# Patient Record
Sex: Female | Born: 2000 | Race: White | Hispanic: No | Marital: Single | Smoking: Former smoker
Health system: Southern US, Community
[De-identification: ages and names within clinical notes are randomized; demographics above are authoritative.]

## PROBLEM LIST (undated history)

## (undated) ENCOUNTER — Inpatient Hospital Stay (HOSPITAL_COMMUNITY): Payer: Self-pay

## (undated) DIAGNOSIS — J45909 Unspecified asthma, uncomplicated: Secondary | ICD-10-CM

## (undated) HISTORY — PX: OTHER SURGICAL HISTORY: SHX169

## (undated) HISTORY — PX: WISDOM TOOTH EXTRACTION: SHX21

## (undated) HISTORY — PX: TONSILLECTOMY: SUR1361

---

## 2018-09-20 ENCOUNTER — Inpatient Hospital Stay (HOSPITAL_COMMUNITY)
Admission: AD | Admit: 2018-09-20 | Discharge: 2018-09-20 | Disposition: A | Discharge status: Home / Self Care | Payer: Medicaid - Out of State | Attending: Obstetrics & Gynecology | Admitting: Obstetrics & Gynecology

## 2018-09-20 ENCOUNTER — Inpatient Hospital Stay (HOSPITAL_COMMUNITY): Payer: Medicaid - Out of State

## 2018-09-20 ENCOUNTER — Encounter (HOSPITAL_COMMUNITY): Payer: Self-pay | Admitting: *Deleted

## 2018-09-20 ENCOUNTER — Other Ambulatory Visit: Payer: Self-pay

## 2018-09-20 DIAGNOSIS — O219 Vomiting of pregnancy, unspecified: Secondary | ICD-10-CM

## 2018-09-20 DIAGNOSIS — Z880 Allergy status to penicillin: Secondary | ICD-10-CM | POA: Diagnosis not present

## 2018-09-20 DIAGNOSIS — Z3A01 Less than 8 weeks gestation of pregnancy: Secondary | ICD-10-CM | POA: Insufficient documentation

## 2018-09-20 DIAGNOSIS — O26891 Other specified pregnancy related conditions, first trimester: Secondary | ICD-10-CM

## 2018-09-20 DIAGNOSIS — R1030 Lower abdominal pain, unspecified: Secondary | ICD-10-CM | POA: Insufficient documentation

## 2018-09-20 DIAGNOSIS — O210 Mild hyperemesis gravidarum: Secondary | ICD-10-CM | POA: Insufficient documentation

## 2018-09-20 DIAGNOSIS — Z87891 Personal history of nicotine dependence: Secondary | ICD-10-CM | POA: Diagnosis not present

## 2018-09-20 DIAGNOSIS — R102 Pelvic and perineal pain: Secondary | ICD-10-CM

## 2018-09-20 HISTORY — DX: Unspecified asthma, uncomplicated: J45.909

## 2018-09-20 LAB — WET PREP, GENITAL
Clue Cells Wet Prep HPF POC: NONE SEEN
Sperm: NONE SEEN
Trich, Wet Prep: NONE SEEN
Yeast Wet Prep HPF POC: NONE SEEN

## 2018-09-20 LAB — URINALYSIS, ROUTINE W REFLEX MICROSCOPIC
Bilirubin Urine: NEGATIVE
GLUCOSE, UA: NEGATIVE mg/dL
Hgb urine dipstick: NEGATIVE
Ketones, ur: 20 mg/dL — AB
NITRITE: NEGATIVE
PH: 5 (ref 5.0–8.0)
Protein, ur: 30 mg/dL — AB
Specific Gravity, Urine: 1.031 — ABNORMAL HIGH (ref 1.005–1.030)

## 2018-09-20 LAB — HCG, QUANTITATIVE, PREGNANCY: hCG, Beta Chain, Quant, S: 112544 m[IU]/mL — ABNORMAL HIGH (ref <5)

## 2018-09-20 LAB — CBC
HCT: 41.7 % (ref 36.0–46.0)
Hemoglobin: 14 g/dL (ref 12.0–15.0)
MCH: 28.3 pg (ref 26.0–34.0)
MCHC: 33.6 g/dL (ref 30.0–36.0)
MCV: 84.2 fL (ref 80.0–100.0)
Platelets: 364 10*3/uL (ref 150–400)
RBC: 4.95 MIL/uL (ref 3.87–5.11)
RDW: 12.5 % (ref 11.5–15.5)
WBC: 14.1 10*3/uL — AB (ref 4.0–10.5)
nRBC: 0 % (ref 0.0–0.2)

## 2018-09-20 LAB — ABO/RH: ABO/RH(D): O NEG

## 2018-09-20 LAB — POCT PREGNANCY, URINE: Preg Test, Ur: POSITIVE — AB

## 2018-09-20 MED ORDER — LACTATED RINGERS IV BOLUS: 1000.0000 mL | Freq: Once | INTRAVENOUS | Status: DC

## 2018-09-20 MED ORDER — SCOPOLAMINE 1 MG/3DAYS TD PT72: 1.0000 | MEDICATED_PATCH | TRANSDERMAL | 12 refills | Status: AC

## 2018-09-20 MED ORDER — PROMETHAZINE HCL 25 MG/ML IJ SOLN
12.5000 mg | Freq: Once | INTRAMUSCULAR | Status: AC
  Administered 2018-09-20: 12.5 mg via INTRAVENOUS
  Filled 2018-09-20: qty 1

## 2018-09-20 MED ORDER — SODIUM CHLORIDE 0.9 % IV SOLN
8.0000 mg | Freq: Once | INTRAVENOUS | Status: AC
  Administered 2018-09-20: 8 mg via INTRAVENOUS
  Filled 2018-09-20: qty 4

## 2018-09-20 MED ORDER — LACTATED RINGERS IV BOLUS
1000.0000 mL | Freq: Once | INTRAVENOUS | Status: AC
  Administered 2018-09-20: 1000 mL via INTRAVENOUS

## 2018-09-20 MED ORDER — PROMETHAZINE HCL 25 MG PO TABS: 12.5000 mg | ORAL_TABLET | Freq: Four times a day (QID) | ORAL | 0 refills | Status: AC | PRN

## 2018-09-20 MED ORDER — SCOPOLAMINE 1 MG/3DAYS TD PT72
1.0000 | MEDICATED_PATCH | TRANSDERMAL | Status: DC
  Administered 2018-09-20: 1.5 mg via TRANSDERMAL
  Filled 2018-09-20: qty 1

## 2018-09-20 MED ORDER — PROMETHAZINE HCL 25 MG/ML IJ SOLN
12.5000 mg | Freq: Once | INTRAMUSCULAR | Status: DC
  Administered 2018-09-20: 12.5 mg via INTRAVENOUS
  Filled 2018-09-20: qty 1

## 2018-09-20 NOTE — Clinical Social Work Note (Signed)
Subjective: Teresa Duncan is a G1P0 at [redacted]w[redacted]d who presents to the [facility] today for MAU visit.  She does not have a history of any mental health concerns. She is currently sexually active. She is currently using no method for birth control. Patient states boyfriend and boyfriend mother as her support system.   BP 129/87   Pulse 92   Temp 98.3 F (36.8 C)   Resp 18   LMP 08/06/2018 (Approximate)   Birth Control History:  Pills, Nexplanon  MDM Patient counseled on all options for birth control today including LARC. Patient desires nexplanon initiated for birth control.   Assessment:  18 y.o. female considering nexplanon for birth control  Plan: No further plan   Gwyndolyn Saxon, [place] 09/20/2018 12:33 PM

## 2018-09-20 NOTE — MAU Note (Signed)
Pt presents to MAU with complaints of nausea and vomiting with lower abdominal cramping. + pregnancy test a couple of weeks ago

## 2018-09-20 NOTE — Discharge Instructions (Signed)
[facility] Prenatal [facility] Providers °         °[facility]  ° Phone: [phone] ° °[facility]  ° Phone: [phone] ° °[facility]      ° Phone: [phone]   °         °[facility]    ° Phone: [phone] °         °[facility] [place] [facility]  ° Phone: [phone] ° °[facility] ° Phone: [phone] °    °[facility] ° Phone: [phone] °Safe Medications in Pregnancy  ° °Acne: °Benzoyl Peroxide °Salicylic Acid ° °Backache/Headache: °Tylenol: 2 regular strength every 4 hours OR °             2 Extra strength every 6 hours ° °Colds/Coughs/Allergies: °Benadryl (alcohol free) 25 mg every 6 hours as needed °Breath right strips °Claritin °Cepacol throat lozenges °Chloraseptic throat spray °Cold-Eeze- up to three times per day °Cough drops, alcohol free °Flonase (by prescription only) °Guaifenesin °Mucinex °Robitussin DM (plain only, alcohol free) °Saline nasal spray/drops °Sudafed (pseudoephedrine) & Actifed ** use only after [redacted] weeks gestation and if you do not have high blood pressure °Tylenol °Vicks Vaporub °Zinc lozenges °Zyrtec  ° °Constipation: °Colace °Ducolax suppositories °Fleet enema °Glycerin suppositories °Metamucil °Milk of magnesia °Miralax °Senokot °Smooth move tea ° °Diarrhea: °Kaopectate °Imodium A-D ° °*NO pepto Bismol ° °Hemorrhoids: °Anusol °Anusol HC °Preparation H °Tucks ° °Indigestion: °Tums °Maalox °Mylanta °Zantac  °Pepcid ° °Insomnia: °Benadryl (alcohol free) 25mg every 6 hours as needed °Tylenol PM °Unisom, no Gelcaps ° °Leg Cramps: °Tums °MagGel ° °Nausea/Vomiting:  °Bonine °Dramamine °Emetrol °Ginger extract °Sea bands °Meclizine  °Nausea medication to take during pregnancy:  °Unisom (doxylamine succinate 25 mg tablets) Take one tablet daily at bedtime. If symptoms are not adequately controlled, the dose  can be increased to a maximum recommended dose of two tablets daily (1/2 tablet in the morning, 1/2 tablet mid-afternoon and one at bedtime). °Vitamin B6 100mg tablets. Take one tablet twice a day (up to 200 mg per day). ° °Skin Rashes: °Aveeno products °Benadryl cream or 25mg every 6 hours as needed °Calamine Lotion °1% cortisone cream ° °Yeast infection: °Gyne-lotrimin 7 °Monistat 7 ° ° °**If taking multiple medications, please check labels to avoid duplicating the same active ingredients °**take medication as directed on the label °** Do not exceed 4000 mg of tylenol in 24 hours °**Do not take medications that contain aspirin or ibuprofen ° ° ° ° °

## 2018-09-20 NOTE — MAU Provider Note (Signed)
History     CSN: [id number]  Arrival date and time: 09/20/18 3474   First Provider Initiated Contact with Patient 09/20/18 0845      Chief Complaint  Patient presents with  . Pelvic Pain  . Emesis   Teresa Duncan is a 18 y.o. G1P0 at [redacted]w[redacted]d (approximate based on uncertain LMP) is here today with lower abdominal pain, nausea and vomiting.   Pelvic Pain  The patient's primary symptoms include pelvic pain. The patient's pertinent negatives include no vaginal discharge. This is a new problem. The current episode started in the past 7 days. The problem occurs intermittently. The problem has been unchanged. Pain severity now: 7/10. The problem affects both sides. She is pregnant. Associated symptoms include abdominal pain, nausea and vomiting. Pertinent negatives include no chills, diarrhea, dysuria, fever or frequency. The vaginal discharge was normal. There has been no bleeding. Nothing aggravates the symptoms. She has tried nothing for the symptoms. She is sexually active. She uses nothing for contraception. Her menstrual history has been regular (Uncertain LMP. Maybe the end of November. ).  Emesis   This is a new problem. The current episode started in the past 7 days. The problem occurs 5 to 10 times per day. The problem has been unchanged. The emesis has an appearance of stomach contents. There has been no fever. Associated symptoms include abdominal pain. Pertinent negatives include no chills, diarrhea or fever. Risk factors: pregnancy  She has tried nothing for the symptoms.    OB History    Gravida  1   Para      Term      Preterm      AB      Living        SAB      TAB      Ectopic      Multiple      Live Births              Past Medical History:  Diagnosis Date  . Asthma     Past Surgical History:  Procedure Laterality Date  . TONSILLECTOMY    . tubes in ears    . WISDOM TOOTH EXTRACTION      History reviewed. No pertinent family history.  Social  History   Tobacco Use  . Smoking status: Former Games developer  . Smokeless tobacco: Former Engineer, water Use Topics  . Alcohol use: Not Currently  . Drug use: Never    Allergies:  Allergies  Allergen Reactions  . Augmentin [Amoxicillin-Pot Clavulanate] Nausea And Vomiting    No medications prior to admission.    Review of Systems  Constitutional: Negative for chills and fever.  Gastrointestinal: Positive for abdominal pain, nausea and vomiting. Negative for diarrhea.  Genitourinary: Positive for pelvic pain. Negative for dysuria, frequency, vaginal bleeding and vaginal discharge.   Physical Exam   Blood pressure 129/87, pulse 92, temperature 98.3 F (36.8 C), resp. rate 18, last menstrual period 08/06/2018.  Physical Exam  Nursing note and vitals reviewed. Constitutional: She is oriented to person, place, and time. She appears well-developed and well-nourished. No distress.  HENT:  Head: Normocephalic.  Cardiovascular: Normal rate.  Respiratory: Effort normal.  GI: Soft. There is no abdominal tenderness. There is no rebound.  Neurological: She is alert and oriented to person, place, and time.  Skin: Skin is warm and dry.  Psychiatric: She has a normal mood and affect.   Results for orders placed or performed during the hospital encounter of  09/20/18 (from the past 24 hour(s))  Urinalysis, Routine w reflex microscopic     Status: Abnormal   Collection Time: 09/20/18  8:22 AM  Result Value Ref Range   Color, Urine YELLOW YELLOW   APPearance TURBID (A) CLEAR   Specific Gravity, Urine 1.031 (H) 1.005 - 1.030   pH 5.0 5.0 - 8.0   Glucose, UA NEGATIVE NEGATIVE mg/dL   Hgb urine dipstick NEGATIVE NEGATIVE   Bilirubin Urine NEGATIVE NEGATIVE   Ketones, ur 20 (A) NEGATIVE mg/dL   Protein, ur 30 (A) NEGATIVE mg/dL   Nitrite NEGATIVE NEGATIVE   Leukocytes, UA MODERATE (A) NEGATIVE   WBC, UA 21-50 0 - 5 WBC/hpf   Bacteria, UA FEW (A) NONE SEEN   Squamous Epithelial / LPF  21-50 0 - 5   Mucus PRESENT    Amorphous Crystal PRESENT   CBC     Status: Abnormal   Collection Time: 09/20/18  8:35 AM  Result Value Ref Range   WBC 14.1 (H) 4.0 - 10.5 K/uL   RBC 4.95 3.87 - 5.11 MIL/uL   Hemoglobin 14.0 12.0 - 15.0 g/dL   HCT 16.141.7 09.636.0 - 04.546.0 %   MCV 84.2 80.0 - 100.0 fL   MCH 28.3 26.0 - 34.0 pg   MCHC 33.6 30.0 - 36.0 g/dL   RDW 40.912.5 81.111.5 - 91.415.5 %   Platelets 364 150 - 400 K/uL   nRBC 0.0 0.0 - 0.2 %  hCG, quantitative, pregnancy     Status: Abnormal   Collection Time: 09/20/18  8:35 AM  Result Value Ref Range   hCG, Beta Chain, Quant, S 112,544 (H) <5 mIU/mL  ABO/Rh     Status: None (Preliminary result)   Collection Time: 09/20/18  8:35 AM  Result Value Ref Range   ABO/RH(D)      O NEG Performed at [facility], [address] [place], [place] [id number]   Pregnancy, urine POC     Status: Abnormal   Collection Time: 09/20/18  8:38 AM  Result Value Ref Range   Preg Test, Ur POSITIVE (A) NEGATIVE  Wet prep, genital     Status: Abnormal   Collection Time: 09/20/18  8:48 AM  Result Value Ref Range   Yeast Wet Prep HPF POC NONE SEEN NONE SEEN   Trich, Wet Prep NONE SEEN NONE SEEN   Clue Cells Wet Prep HPF POC NONE SEEN NONE SEEN   WBC, Wet Prep HPF POC MANY (A) NONE SEEN   Sperm NONE SEEN    [place] Ob Less Than 14 Weeks With Ob Transvaginal  Result Date: 09/20/2018 CLINICAL DATA:  Patient with lower abdominal cramping. Nausea and vomiting. EXAM: OBSTETRIC <14 WK US AND TRANSVAGINAL OB US TECHNIQUE: Both transabdominal and transvaginal ultrasound examinations were performed for complete evaluation of the gestation as well as the maternal uterus, adnexal regions, and pelvic cul-de-sac. Transvaginal technique was performed to assess early pregnancy. COMPARISON:  None. FINDINGS: Intrauterine gestational sac: Single Yolk sac:  Visualized. Embryo:  Visualized. Cardiac Activity: Visualized. Heart Rate: 135 bpm CRL:  7.61 mm   6 w   4 d                  US EDC:  05/12/2019 Subchorionic hemorrhage:  None visualized. Maternal uterus/adnexae: Normal right and left ovaries. Right ovarian corpus luteum. Trace free fluid in the pelvis. IMPRESSION: Single live intrauterine gestation.  No subchorionic hemorrhage. Electronically Signed   By: Annia Beltrew  Davis M.D.   On: 09/20/2018 10:01  MAU Course  Procedures  MDM 10:18 AM patient given PO challenge with ginger ale and immediately vomited. Will given another 12.5 mg phenergan.   Will base EDC on today's [place] since the LMP documented was a "guess".   10:58 AM patient still vomiting after a total of 25mg  phenergan. Will try Zofran.   11:25 AM patient continues to have vomiting. Will apply scopolamine patch.   1:17 PM Scop patch applied by RN. Patient is now resting comfortably and has sips of ginger ale without further emesis. Will DC home and FU in the clinic.   Assessment and Plan   1. Nausea/vomiting in pregnancy   2. Pelvic pain in pregnancy, antepartum, first trimester   3. [redacted] weeks gestation of pregnancy    DC home Comfort measures reviewed  1st Trimester precautions  Bleeding precautions RX: phenergan PRN #30, scopolamine patch q 72 hours Return to MAU as needed FU with OB as planned  Follow-up Information    [facility] Follow up.   Specialty:  Obstetrics and Gynecology Contact information: [address] [place] [address] [phone]           Thressa Sheller DNP, CNM  09/20/18  10:18 AM

## 2018-09-21 LAB — GC/CHLAMYDIA PROBE AMP (~~LOC~~) NOT AT ARMC
Chlamydia: POSITIVE — AB
Neisseria Gonorrhea: NEGATIVE

## 2018-09-23 ENCOUNTER — Encounter: Payer: Self-pay | Admitting: Student

## 2018-09-23 DIAGNOSIS — O98811 Other maternal infectious and parasitic diseases complicating pregnancy, first trimester: Secondary | ICD-10-CM

## 2018-09-23 DIAGNOSIS — A749 Chlamydial infection, unspecified: Secondary | ICD-10-CM | POA: Insufficient documentation

## 2018-10-18 ENCOUNTER — Telehealth: Payer: Self-pay | Admitting: Student

## 2018-10-18 NOTE — Telephone Encounter (Signed)
Opened in error

## 2018-10-31 ENCOUNTER — Other Ambulatory Visit: Payer: Self-pay

## 2018-10-31 ENCOUNTER — Inpatient Hospital Stay (HOSPITAL_COMMUNITY)
Admission: AD | Admit: 2018-10-31 | Discharge: 2018-10-31 | Disposition: A | Discharge status: Home / Self Care | Payer: Medicaid - Out of State | Attending: Obstetrics & Gynecology | Admitting: Obstetrics & Gynecology

## 2018-10-31 DIAGNOSIS — Z87891 Personal history of nicotine dependence: Secondary | ICD-10-CM | POA: Insufficient documentation

## 2018-10-31 DIAGNOSIS — O26891 Other specified pregnancy related conditions, first trimester: Secondary | ICD-10-CM | POA: Insufficient documentation

## 2018-10-31 DIAGNOSIS — Z3491 Encounter for supervision of normal pregnancy, unspecified, first trimester: Secondary | ICD-10-CM

## 2018-10-31 DIAGNOSIS — Z3A12 12 weeks gestation of pregnancy: Secondary | ICD-10-CM | POA: Insufficient documentation

## 2018-10-31 DIAGNOSIS — N898 Other specified noninflammatory disorders of vagina: Secondary | ICD-10-CM | POA: Diagnosis not present

## 2018-10-31 DIAGNOSIS — Z880 Allergy status to penicillin: Secondary | ICD-10-CM | POA: Insufficient documentation

## 2018-10-31 LAB — URINALYSIS, ROUTINE W REFLEX MICROSCOPIC
Bilirubin Urine: NEGATIVE
Glucose, UA: NEGATIVE mg/dL
Hgb urine dipstick: NEGATIVE
Ketones, ur: 20 mg/dL — AB
Nitrite: NEGATIVE
Protein, ur: NEGATIVE mg/dL
SPECIFIC GRAVITY, URINE: 1.026 (ref 1.005–1.030)
pH: 5 (ref 5.0–8.0)

## 2018-10-31 LAB — WET PREP, GENITAL
Clue Cells Wet Prep HPF POC: NONE SEEN
Sperm: NONE SEEN
Trich, Wet Prep: NONE SEEN
Yeast Wet Prep HPF POC: NONE SEEN

## 2018-10-31 MED ORDER — PREPLUS 27-1 MG PO TABS: 1.0000 | ORAL_TABLET | Freq: Every day | ORAL | 13 refills | Status: AC

## 2018-10-31 NOTE — Discharge Instructions (Signed)

## 2018-10-31 NOTE — MAU Note (Signed)
Hasn't really been feeling well. This is her first, wanted to make sure everything is ok with her baby.  Has been throwing up a lot.  Has been having some back pains, none now.

## 2018-10-31 NOTE — MAU Provider Note (Signed)
History     CSN: [id number]  Arrival date and time: 10/31/18 1400   First Provider Initiated Contact with Patient 10/31/18 1821      No chief complaint on file.  HPI Teresa Duncan is a 18 y.o. G1P0 at [redacted]w[redacted]d who presents to MAU with chief complaint of nausea and "just wanting to check on baby". Patient is accompanied by her baby's paternal grandmother, Edyth Gunnels. They verbalized that they believed they had signed in to [facility] and did not realize they were presenting to MAU. Ms. Leister denies vaginal bleeding, fever, falls, or recent illness.  She complains of intermittent heavy vaginal discharge but denies other complaints.  Patient has not initiated prenatal care. She has been unable to obtain her identification from her mother and does not know her social security number.  OB History    Gravida  1   Para      Term      Preterm      AB      Living        SAB      TAB      Ectopic      Multiple      Live Births              Past Medical History:  Diagnosis Date  . Asthma     Past Surgical History:  Procedure Laterality Date  . TONSILLECTOMY    . tubes in ears    . WISDOM TOOTH EXTRACTION      No family history on file.  Social History   Tobacco Use  . Smoking status: Former Games developer  . Smokeless tobacco: Former Engineer, water Use Topics  . Alcohol use: Not Currently  . Drug use: Never    Allergies:  Allergies  Allergen Reactions  . Augmentin [Amoxicillin-Pot Clavulanate] Nausea And Vomiting    Medications Prior to Admission  Medication Sig Dispense Refill Last Dose  . promethazine (PHENERGAN) 25 MG tablet Take 0.5-1 tablets (12.5-25 mg total) by mouth every 6 (six) hours as needed. 30 tablet 0   . scopolamine (TRANSDERM-SCOP, 1.5 MG,) 1 MG/3DAYS Place 1 patch (1.5 mg total) onto the skin every 3 (three) days. 10 patch 12     Review of Systems  Constitutional: Negative for chills, fatigue and fever.  Respiratory:  Negative for shortness of breath.   Gastrointestinal: Positive for nausea. Negative for abdominal pain, diarrhea and vomiting.  Genitourinary: Negative for difficulty urinating, dysuria, flank pain, vaginal bleeding, vaginal discharge and vaginal pain.  Musculoskeletal: Negative for back pain.  All other systems reviewed and are negative.  Physical Exam   Blood pressure 122/76, pulse 88, temperature 98.5 F (36.9 C), temperature source Oral, resp. rate 16, weight 50.1 kg, SpO2 100 %.  Physical Exam  Nursing note and vitals reviewed. Constitutional: She is oriented to person, place, and time. She appears well-developed and well-nourished.  Cardiovascular: Normal rate.  Respiratory: Effort normal and breath sounds normal.  GI: Soft. She exhibits no distension. There is no abdominal tenderness. There is no rebound, no guarding and no CVA tenderness.  Musculoskeletal: Normal range of motion.  Neurological: She is alert and oriented to person, place, and time.  Skin: Skin is warm and dry.  Psychiatric: She has a normal mood and affect. Her behavior is normal. Judgment and thought content normal.    MAU Course/MDM  Procedures  --[facility] ultrasound 09/20/2018 --Pertinent negatives: dysuria, hx UTIs or kidney stones  Patient Vitals  for the past 24 hrs:  BP Temp Temp src Pulse Resp SpO2 Weight  10/31/18 1438 122/76 98.5 F (36.9 C) Oral 88 16 100 % 50.1 kg    Orders Placed This Encounter  Procedures  . Wet prep, genital  . Culture, OB Urine  . Urinalysis, Routine w reflex microscopic  . Discharge patient    Results for orders placed or performed during the hospital encounter of 10/31/18 (from the past 24 hour(s))  Urinalysis, Routine w reflex microscopic     Status: Abnormal   Collection Time: 10/31/18  3:00 PM  Result Value Ref Range   Color, Urine AMBER (A) YELLOW   APPearance CLOUDY (A) CLEAR   Specific Gravity, Urine 1.026 1.005 - 1.030   pH 5.0 5.0 - 8.0   Glucose, UA  NEGATIVE NEGATIVE mg/dL   Hgb urine dipstick NEGATIVE NEGATIVE   Bilirubin Urine NEGATIVE NEGATIVE   Ketones, ur 20 (A) NEGATIVE mg/dL   Protein, ur NEGATIVE NEGATIVE mg/dL   Nitrite NEGATIVE NEGATIVE   Leukocytes,Ua MODERATE (A) NEGATIVE   RBC / HPF 0-5 0 - 5 RBC/hpf   WBC, UA 11-20 0 - 5 WBC/hpf   Bacteria, UA RARE (A) NONE SEEN   Squamous Epithelial / LPF 11-20 0 - 5   Mucus PRESENT   Wet prep, genital     Status: Abnormal   Collection Time: 10/31/18  6:50 PM  Result Value Ref Range   Yeast Wet Prep HPF POC NONE SEEN NONE SEEN   Trich, Wet Prep NONE SEEN NONE SEEN   Clue Cells Wet Prep HPF POC NONE SEEN NONE SEEN   WBC, Wet Prep HPF POC MANY (A) NONE SEEN   Sperm NONE SEEN     Assessment and Plan  --18 y.o. G1P0 at [redacted]w[redacted]d  --FHT 174 by Doppler --Given follow up information including OB offices in [place], [facility] walk-in clinic hours, safe meds --Discharge home in stable condition  F/U: Will message Gwyndolyn Saxon, LCSW to discuss Pregnancy Medicaid, contraception, follow-up care   Calvert Cantor, CNM 10/31/2018, 7:26 PM

## 2018-11-02 LAB — CULTURE, OB URINE: Culture: >=100000 — AB

## 2018-11-04 LAB — GC/CHLAMYDIA PROBE AMP (~~LOC~~) NOT AT ARMC
Chlamydia: POSITIVE — AB
Neisseria Gonorrhea: NEGATIVE

## 2018-11-06 ENCOUNTER — Telehealth: Payer: Self-pay | Admitting: Advanced Practice Midwife

## 2018-11-06 NOTE — Telephone Encounter (Signed)
2nd attempt to reach patient to discuss swab results. Spoke with FOB's mom "Andrey Campanile" who owns the phone listed in patient chart. Asked her to notify patient I called, did not state reason or disclose any aspect of care. Andrey Campanile stated patient was with "Reita Cliche", Sandy's son but she does not know his number. She will "try to" locate patient and ask her to return my call.  Clayton Bibles, CNM 11/06/18  3:56 PM

## 2018-11-06 NOTE — Telephone Encounter (Signed)
Called to notify of POSITIVE Chlamydia. Phone number is for FOB's mother Andrey Campanile who patient would like to use as primary POC. VLTCB  Clayton Bibles, CNM 11/06/18  10:20 AM

## 2019-08-04 ENCOUNTER — Encounter (HOSPITAL_COMMUNITY): Payer: Self-pay

## 2020-01-20 IMAGING — US US OB < 14 WEEKS - US OB TV
1 series · 15 of 28 positions shown · non-contrast
Comparison: None.

CLINICAL DATA: Patient with lower abdominal cramping. Nausea and
vomiting.

EXAM:
OBSTETRIC <14 WK US AND TRANSVAGINAL OB US
TECHNIQUE: Both transabdominal and transvaginal ultrasound examinations were
performed for complete evaluation of the gestation as well as the
maternal uterus, adnexal regions, and pelvic cul-de-sac.
Transvaginal technique was performed to assess early pregnancy.

[Series 1: us ob < 14 weeks - us ob tv · 15 of 91 slices shown]
[im 1/91]
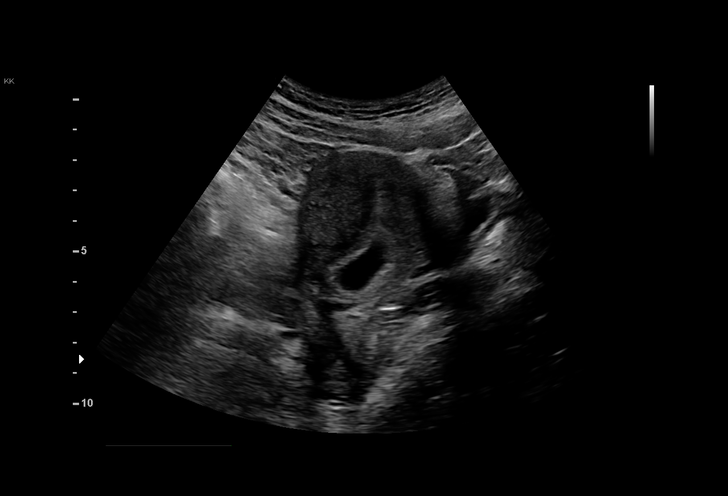
[im 7/91]
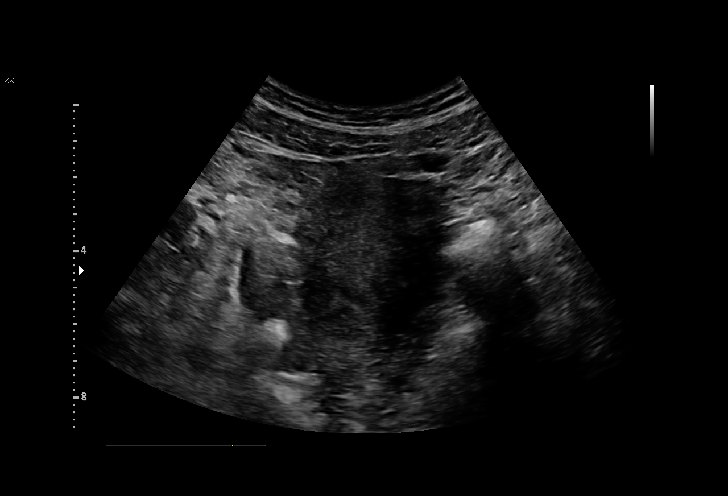
[im 14/91]
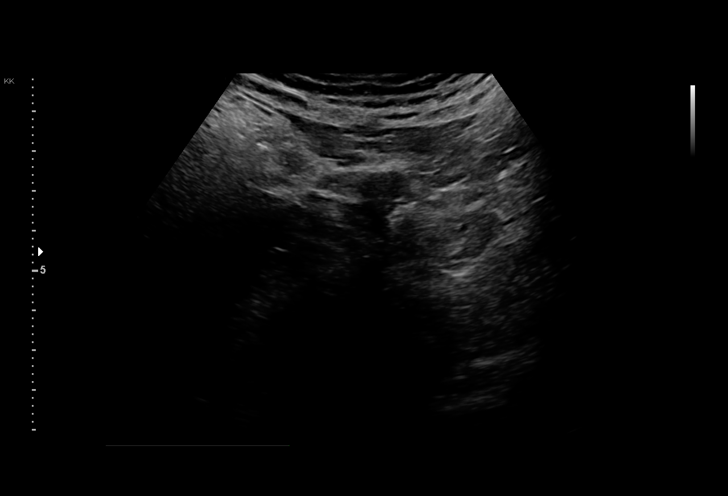
[im 21/91]
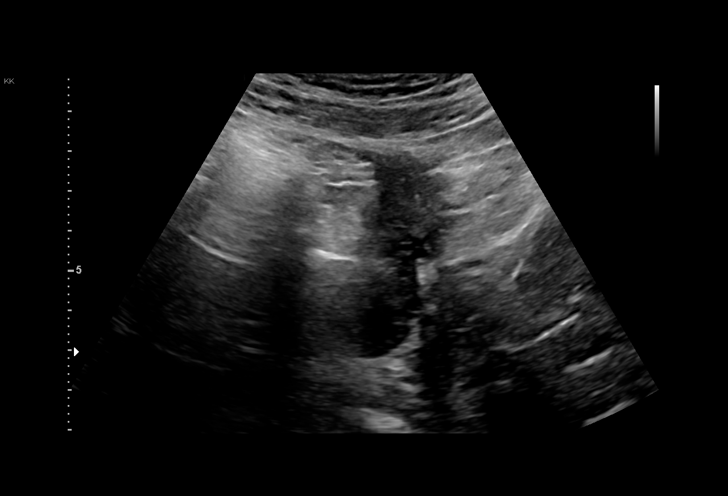
[im 27/91]
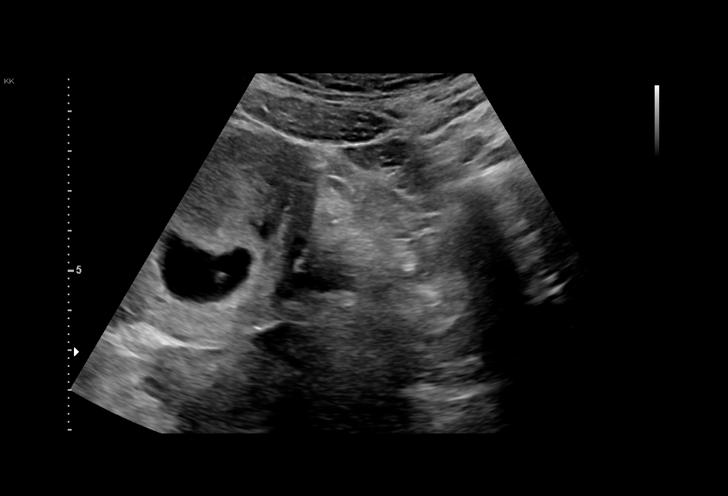
[im 34/91]
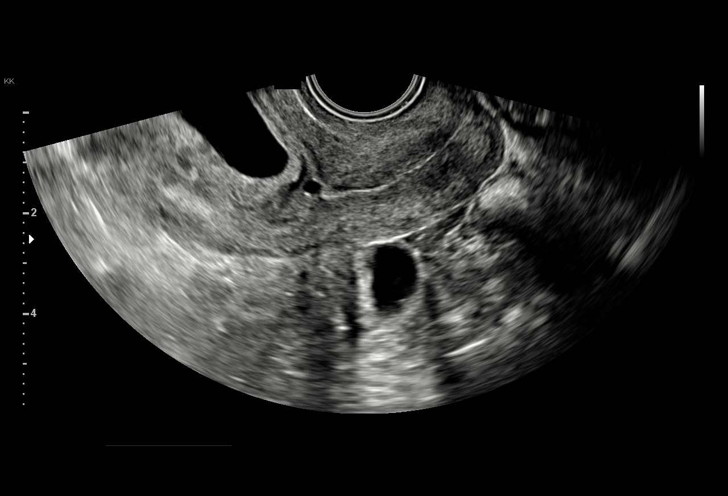
[im 41/91]
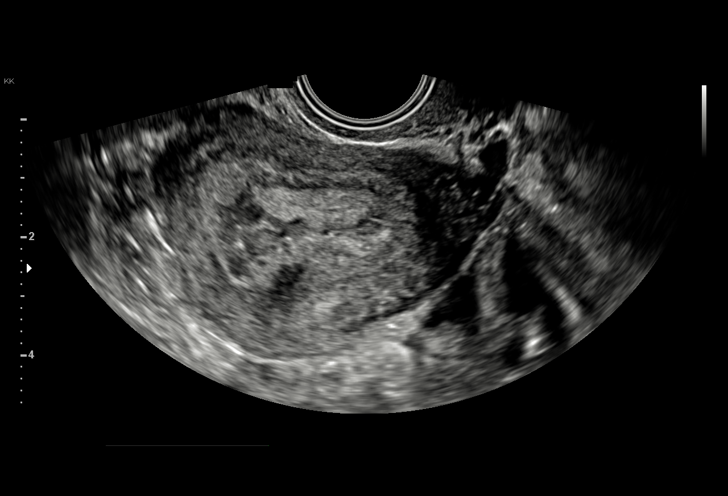
[im 47/91]
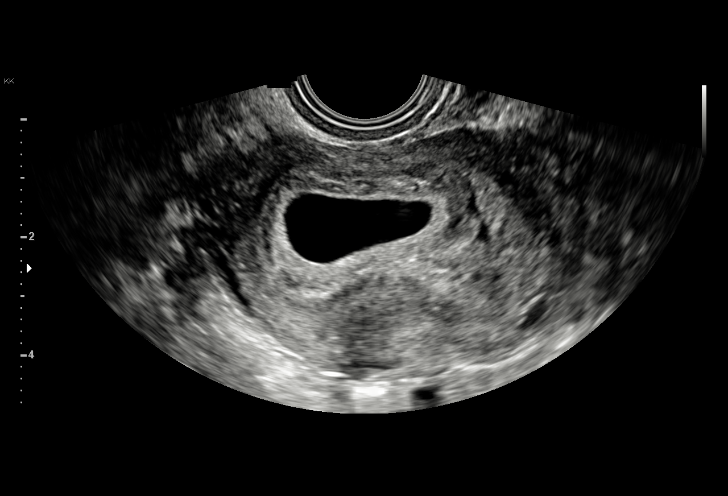
[im 51/91]
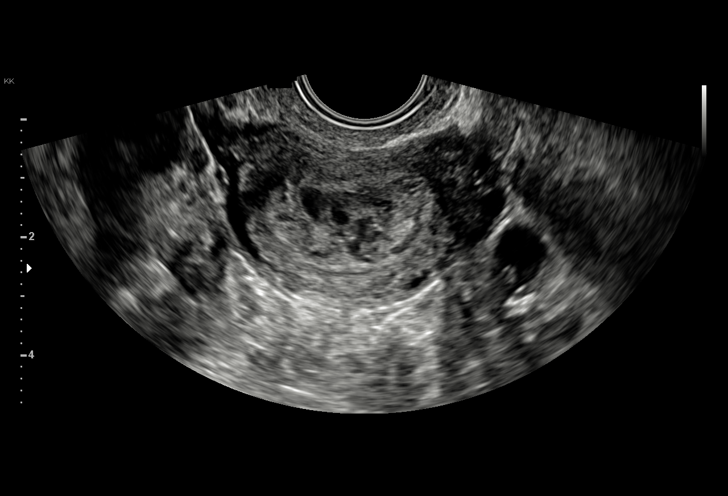
[im 57/91]
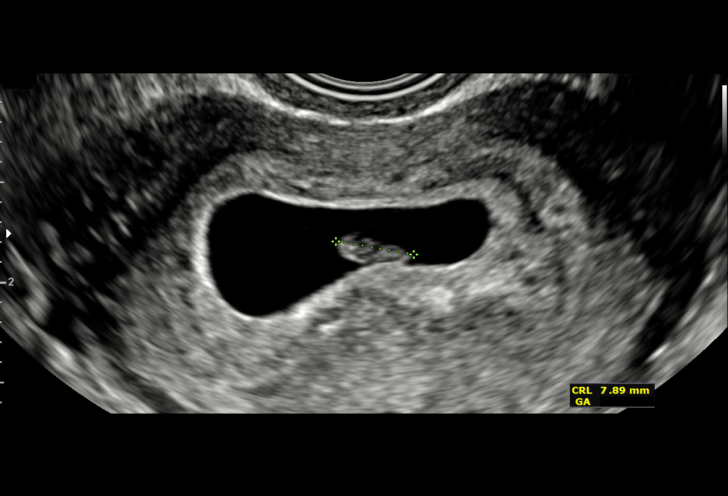
[im 64/91]
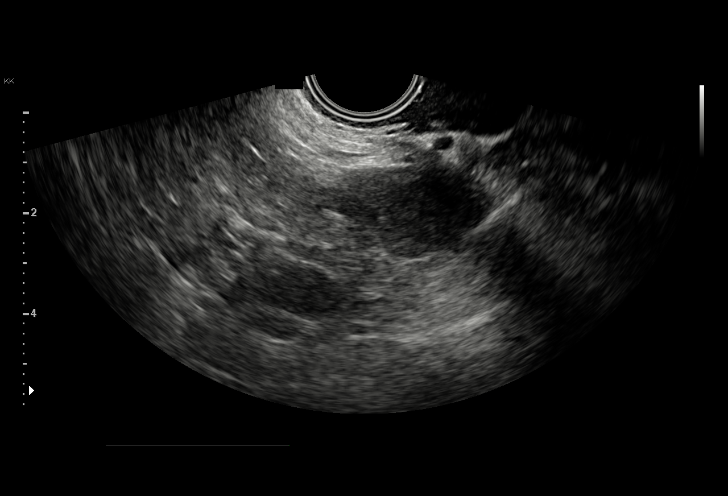
[im 71/91]
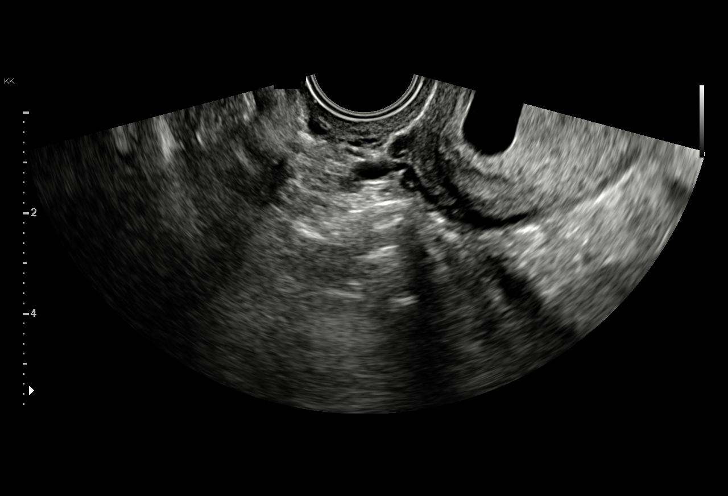
[im 77/91]
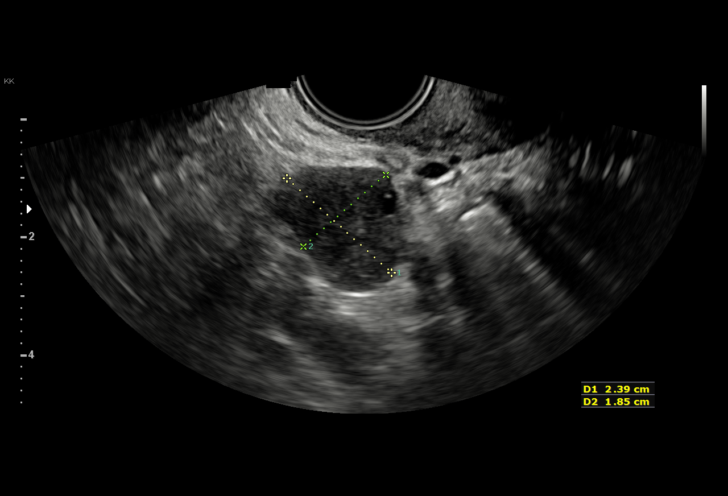
[im 84/91]
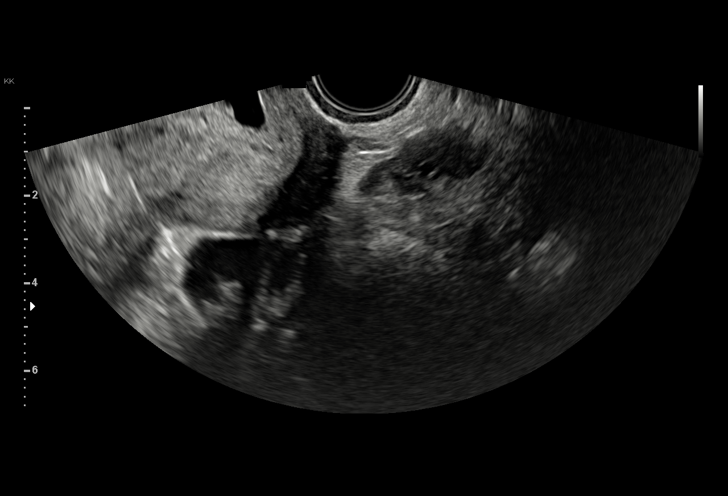
[im 91/91]
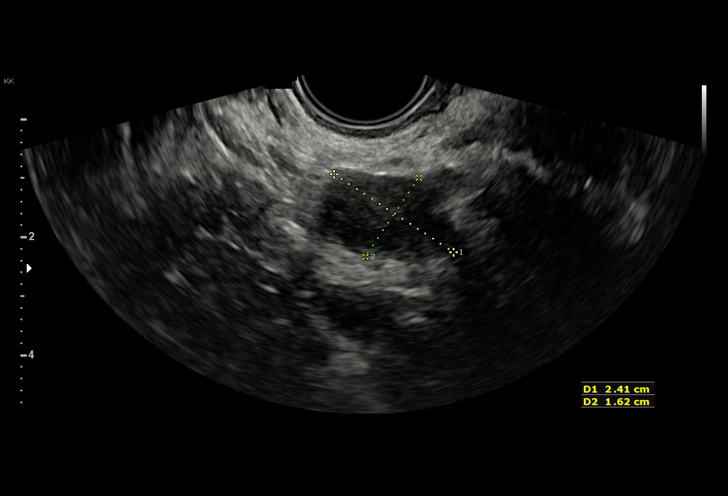

[15 of 28 positions shown; findings below may reference images not displayed]

FINDINGS: Intrauterine gestational sac: Single

Yolk sac:  Visualized.

Embryo:  Visualized.

Cardiac Activity: Visualized.

Heart Rate: 135 bpm

CRL:  7.61 mm   6 w   4 d                  US EDC: 05/12/2019

Subchorionic hemorrhage:  None visualized.

Maternal uterus/adnexae: Normal right and left ovaries. Right
ovarian corpus luteum. Trace free fluid in the pelvis.
IMPRESSION: Single live intrauterine gestation.  No subchorionic hemorrhage.
